# Patient Record
Sex: Male | Born: 1955 | Race: White | Hispanic: No | Marital: Married | Smoking: Never smoker
Health system: Southern US, Community
[De-identification: ages and names within clinical notes are randomized; demographics above are authoritative.]

---

## 2016-06-29 ENCOUNTER — Ambulatory Visit: Payer: 59 | Attending: Family Medicine

## 2016-06-29 DIAGNOSIS — M25652 Stiffness of left hip, not elsewhere classified: Secondary | ICD-10-CM | POA: Diagnosis present

## 2016-06-29 DIAGNOSIS — M25552 Pain in left hip: Secondary | ICD-10-CM

## 2016-06-29 NOTE — Patient Instructions (Addendum)
Perform all exercises below:  Hold _20___ seconds. Repeat _3___ times.  Do __3__ sessions per day. CAUTION: Movement should be gentle, steady and slow.  Knee to Chest  Lying supine, bend involved knee to chest. Perform with each leg.   HIP: Hamstrings - Short Sitting   Rest leg on raised surface. Keep knee straight. Lift chest.   Butterfly, Supine    Lie on back, feet together. Lower knees toward floor. Hold _20__ seconds. Repeat _3__ times per session. Do _3__ sessions per day.  Copyright  VHI. All rights reserved.  Hip Flexor Stretch    Lying on back near edge of bed, bend one leg, foot flat. Hang other leg over edge, relaxed, thigh resting entirely on bed for 20 seconds. Repeat _3___ times. Do __3__ sessions per day. Advanced Exercise: Bend knee back keeping thigh in contact with bed.  http://gt2.exer.us/346   Copyright  VHI. All rights reserved.    [facility] [address] [place], [place] [address] Phone # [phone] Fax [phone]

## 2016-06-29 NOTE — Therapy (Signed)
[facility] [address], [place], [place], [address] Phone: [phone]   Fax:  [phone]  Physical Therapy Evaluation  Patient Details  Name: Jimmy Tate MRN: [id number] Date of Birth: April 16, 1956 Referring Provider: Lujean Amel, MD  Encounter Date: 06/29/2016      PT End of Session - 06/29/16 0839    Visit Number 1   Date for PT Re-Evaluation 08/24/16   PT Start Time 0801   PT Stop Time 0835   PT Time Calculation (min) 34 min   Activity Tolerance Patient tolerated treatment well   Behavior During Therapy [facility] for tasks assessed/performed      History reviewed. No pertinent past medical history.  History reviewed. No pertinent surgical history.  There were no vitals filed for this visit.       Subjective Assessment - 06/29/16 0807    Subjective Pt presents to PT with complaints of Lt hip pain that began ~09/2015.  Pt reports that he overstretched it after a rowing workout.  Pt reports that pain has increased over the past 1 month.     How long can you sit comfortably? no limitation   How long can you stand comfortably? no limitation   How long can you walk comfortably? no limitation   Diagnostic tests none   Patient Stated Goals sleep without interruption, reduce Lt hip pain   Currently in Pain? Yes   Pain Score 3   no pain now, 3-5/10 at night   Pain Location Hip   Pain Orientation Left   Pain Descriptors / Indicators Aching;Sore;Dull   Pain Type Chronic pain   Pain Onset More than a month ago   Pain Frequency Intermittent   Aggravating Factors  at night with sleep, walking up steps    Pain Relieving Factors Advil, Biofreeze   Effect of Pain on Daily Activities sleep is disrupted- waking 1-2 times a night, pain with steps            [facility] PT Assessment - 06/29/16 0001      Assessment   Medical Diagnosis strain of Lt hip   Referring Provider Lujean Amel, MD   Onset Date/Surgical  Date 09/30/15   Next MD Visit 6 months   Prior Therapy none     Precautions   Precautions None     Restrictions   Weight Bearing Restrictions No     Balance Screen   Has the patient fallen in the past 6 months No   Has the patient had a decrease in activity level because of a fear of falling?  No   Is the patient reluctant to leave their home because of a fear of falling?  No     Home Environment   Living Environment Private residence   Living Arrangements Spouse/significant other   Type of [place] Access Level entry   [place] One level     Prior Function   Level of Independence Independent   Vocation Full time employment   Vocation Requirements desk work   Leisure tennis, walking for exercise     Cognition   Overall Cognitive Status Within Functional Limits for tasks assessed     Observation/Other Assessments   Focus on Therapeutic Outcomes (FOTO)  34% limitation     Posture/Postural Control   Posture/Postural Control No significant limitations     ROM / Strength   AROM / PROM / Strength AROM;PROM;Strength     AROM   Overall  AROM  Deficits   Overall AROM Comments Lumbar AROM is full without pain.  Lt hip AROM limited by 40% and Rt hip limited by 25%     PROM   Overall PROM  Deficits   Overall PROM Comments Lt hip flexiblity limited by 40%, Rt limited by 25% no pain     Strength   Overall Strength Within functional limits for tasks performed   Overall Strength Comments 4+/5 LE strength throughout     Palpation   Palpation comment no significant palpable tenderness today.  No tenderness at Lt quadartus, greater trochanter or gluteals     Transfers   Transfers Independent with all Transfers     Ambulation/Gait   Ambulation/Gait Yes   Ambulation/Gait Assistance 7: Independent   Gait Pattern Within Functional Limits                           PT Education - 06/29/16 0829    Education provided Yes   Education Details HEP:  hip stretches   Person(s) Educated Patient   Methods Explanation;Demonstration;Handout   Comprehension Verbalized understanding;Returned demonstration          PT Short Term Goals - 06/29/16 0812      PT SHORT TERM GOAL #1   Title be independent in initial HEP   Time 4   Period Weeks   Status New     PT SHORT TERM GOAL #2   Title ascend steps with 25% reduction in Lt hip pain   Time 4   Period Weeks   Status New     PT SHORT TERM GOAL #3   Title sleep with 25% less Lt hip pain and fewer interruptions   Time 4   Period Weeks   Status New           PT Long Term Goals - 06/29/16 0802      PT LONG TERM GOAL #1   Title be independent in advanced HEP   Time 8   Period Weeks   Status New     PT LONG TERM GOAL #2   Title reduce FOTO to < or = to 26% limitation   Time 8   Period Weeks   Status New     PT LONG TERM GOAL #3   Title ascend steps with 60% less Lt hip pain   Time 8   Period Weeks   Status New     PT LONG TERM GOAL #4   Title sleep without interruption due to Lt hip pain   Time 8   Period Weeks   Status New               Plan - 06/29/16 [id number]    Clinical Impression Statement Pt presents to PT with complaints of Lt hip pain that began 09/2015 after stretching after a workout.  Pain has worsened over the past month.  Pt reports that his pain is mostly present only at night and rates the pain in the hip as 3-5/10.  Pt also has pain with negotiating steps.  Pt demonstrates limited hip flexiblity Lt>Rt.  Pt is a low complexity evaluation due to no comorbidities and stable condition.  Pt will benefit from skilled PT for Lt hip flexiblity, strength, manual and modalities to reduce Lt hip pain.     Rehab Potential Good   PT Frequency 2x / week   PT Duration 8 weeks   PT Treatment/Interventions ADLs/Self Care  Home Management;Cryotherapy;Electrical Stimulation;Functional mobility training;Stair training;Ultrasound;Moist Heat;Therapeutic  activities;Therapeutic exercise;Balance training;Neuromuscular re-education;Patient/family education;Passive range of motion;Manual techniques;Dry needling;Taping   PT Next Visit Plan Lt hip flexiblity, manual, modalities, strength   Consulted and Agree with Plan of Care Patient      Patient will benefit from skilled therapeutic intervention in order to improve the following deficits and impairments:  Pain, Decreased mobility, Decreased activity tolerance, Impaired flexibility  Visit Diagnosis: Pain in left hip - Plan: PT plan of care cert/re-cert  Stiffness of left hip, not elsewhere classified - Plan: PT plan of care cert/re-cert     Problem List There are no active problems to display for this patient.    Sigurd Sos, PT 06/29/16 8:41 AM  [facility] [address], [place], [place], [address] Phone: [phone]   Fax:  [phone]  Name: Constantinos Arballo MRN: [id number] Date of Birth: Mar 11, 1956

## 2016-07-01 ENCOUNTER — Ambulatory Visit: Payer: 59

## 2016-07-01 DIAGNOSIS — M25552 Pain in left hip: Secondary | ICD-10-CM

## 2016-07-01 DIAGNOSIS — M25652 Stiffness of left hip, not elsewhere classified: Secondary | ICD-10-CM

## 2016-07-01 NOTE — Patient Instructions (Addendum)
Abduction: Clam (Eccentric) - Side-Lying    Lie on side with knees bent. Lift top knee, keeping feet together. Keep trunk steady. Do 2 sets of 10 on each side http://ecce.exer.us/64   Copyright  VHI. All rights reserved.  Bridging    Slowly raise buttocks from floor, keeping stomach tight.  Hold 5 seconds  Repeat _2x10___ times per set. Do ____ sets per session. Do _1___ sessions per day.  http://orth.exer.us/1096   Copyright  VHI. All rights reserved.  [facility] [address], [place],  [address] Phone # [phone] Fax [phone]

## 2016-07-01 NOTE — Therapy (Signed)
[facility] [address], [place], [place], [address] Phone: [phone]   Fax:  [phone]  Physical Therapy Treatment  Patient Details  Name: Jimmy Tate MRN: [id number] Date of Birth: 1956-01-01 Referring Provider: Lujean Amel, MD  Encounter Date: 07/01/2016      PT End of Session - 07/01/16 0837    Visit Number 2   Date for PT Re-Evaluation 08/24/16   PT Start Time 0800   PT Stop Time 0839   PT Time Calculation (min) 39 min   Activity Tolerance Patient tolerated treatment well   Behavior During Therapy [facility] for tasks assessed/performed      History reviewed. No pertinent past medical history.  History reviewed. No pertinent surgical history.  There were no vitals filed for this visit.      Subjective Assessment - 07/01/16 0804    Subjective Feeling good.  Pain is usually at night so no pain right now.   Patient Stated Goals sleep without interruption, reduce Lt hip pain   Currently in Pain? No/denies                         [facility] Adult PT Treatment/Exercise - 07/01/16 0001      Exercises   Exercises Lumbar;Knee/Hip     Lumbar Exercises: Stretches   Active Hamstring Stretch 3 reps;20 seconds   Hip Flexor Stretch 3 reps;20 seconds   Piriformis Stretch 3 reps;20 seconds     Lumbar Exercises: Sidelying   Clam 20 reps     Knee/Hip Exercises: Stretches   Other Knee/Hip Stretches butterfly 3x20 seconds     Knee/Hip Exercises: Aerobic   Nustep Level 2x 8 minutes-legs only     Knee/Hip Exercises: Supine   Bridges Strengthening;Both;2 sets;10 reps                PT Education - 07/01/16 0830    Education provided Yes   Education Details bridge and clam shells   Person(s) Educated Patient   Methods Explanation;Demonstration;Handout   Comprehension Verbalized understanding;Returned demonstration          PT Short Term Goals - 06/29/16 0812      PT SHORT TERM  GOAL #1   Title be independent in initial HEP   Time 4   Period Weeks   Status New     PT SHORT TERM GOAL #2   Title ascend steps with 25% reduction in Lt hip pain   Time 4   Period Weeks   Status New     PT SHORT TERM GOAL #3   Title sleep with 25% less Lt hip pain and fewer interruptions   Time 4   Period Weeks   Status New           PT Long Term Goals - 06/29/16 0802      PT LONG TERM GOAL #1   Title be independent in advanced HEP   Time 8   Period Weeks   Status New     PT LONG TERM GOAL #2   Title reduce FOTO to < or = to 26% limitation   Time 8   Period Weeks   Status New     PT LONG TERM GOAL #3   Title ascend steps with 60% less Lt hip pain   Time 8   Period Weeks   Status New     PT LONG TERM GOAL #4   Title sleep without interruption due  to Lt hip pain   Time 8   Period Weeks   Status New               Plan - 07/01/16 [id number]    Clinical Impression Statement Pt with only 1 session after evaluation.  Pt is independent in initial HEP for flexibility.  Pt tolerated advancement of strength exercises today.  Pt reports intermittent pain at night.  No pain reported today.  Pt will continue to benefit from skilled PT for strength, flexiblity and manual/modalities for pain as needed.     Rehab Potential Good   PT Frequency 2x / week   PT Duration 8 weeks   PT Treatment/Interventions ADLs/Self Care Home Management;Cryotherapy;Electrical Stimulation;Functional mobility training;Stair training;Ultrasound;Moist Heat;Therapeutic activities;Therapeutic exercise;Balance training;Neuromuscular re-education;Patient/family education;Passive range of motion;Manual techniques;Dry needling;Taping   PT Next Visit Plan Lt hip flexiblity, manual, modalities, strength   Consulted and Agree with Plan of Care Patient      Patient will benefit from skilled therapeutic intervention in order to improve the following deficits and impairments:  Pain, Decreased mobility,  Decreased activity tolerance, Impaired flexibility  Visit Diagnosis: Pain in left hip  Stiffness of left hip, not elsewhere classified     Problem List There are no active problems to display for this patient.    Sigurd Sos, PT 07/01/16 8:40 AM  [facility] [address] [place], [place], [address] Phone: [phone]   Fax:  [phone]  Name: Jimmy Tate MRN: [id number] Date of Birth: [birth year]

## 2016-07-07 ENCOUNTER — Encounter: Payer: 59 | Admitting: Physical Therapy

## 2016-07-12 ENCOUNTER — Ambulatory Visit: Payer: 59 | Admitting: Physical Therapy

## 2016-07-12 ENCOUNTER — Encounter: Payer: Self-pay | Admitting: Physical Therapy

## 2016-07-12 DIAGNOSIS — M25552 Pain in left hip: Secondary | ICD-10-CM | POA: Diagnosis not present

## 2016-07-12 DIAGNOSIS — M25652 Stiffness of left hip, not elsewhere classified: Secondary | ICD-10-CM

## 2016-07-12 NOTE — Therapy (Signed)
[facility] [address] [place], [place], [address] Phone: [phone]   Fax:  [phone]  Physical Therapy Treatment  Patient Details  Name: Jimmy Tate MRN: [id number] Date of Birth: [birth year] Referring Provider: Lujean Amel, MD  Encounter Date: 07/12/2016      PT End of Session - 07/12/16 0852    Visit Number 3   Date for PT Re-Evaluation 08/24/16   PT Start Time [id number]   PT Stop Time 0927   PT Time Calculation (min) 40 min   Activity Tolerance Patient tolerated treatment well   Behavior During Therapy [facility] for tasks assessed/performed      History reviewed. No pertinent past medical history.  History reviewed. No pertinent surgical history.  There were no vitals filed for this visit.      Subjective Assessment - 07/12/16 0849    Subjective Walked 3 miles a day over the weekend and it was a little sore but not too bad.  States it has been better overall and he has been able to sleep through the night   Currently in Pain? No/denies                         [facility] Adult PT Treatment/Exercise - 07/12/16 0001      Lumbar Exercises: Stretches   Active Hamstring Stretch 3 reps;20 seconds   Hip Flexor Stretch 3 reps;20 seconds   Quad Stretch 3 reps;20 seconds   Piriformis Stretch 3 reps;20 seconds     Knee/Hip Exercises: Aerobic   Nustep Level 3x 8 minutes-legs only     Knee/Hip Exercises: Standing   Hip ADduction Strengthening;Both;2 sets;10 reps   Lateral Step Up Left;2 sets;10 reps   Forward Step Up Left;2 sets;10 reps   Wall Squat 20 reps;3 seconds     Knee/Hip Exercises: Supine   Bridges Strengthening;Both;2 sets;10 reps  yellow theraband for glute med activation     Knee/Hip Exercises: Sidelying   Clams 20x yellow theraband Lt LE                PT Education - 07/12/16 [phone]    Education provided Yes   Education Details educated on adding yellow theraband to  exercises and forward and lateral step ups   Person(s) Educated Patient   Methods Explanation;Verbal cues;Demonstration   Comprehension Verbalized understanding;Returned demonstration          PT Short Term Goals - 07/12/16 0917      PT SHORT TERM GOAL #1   Title be independent in initial HEP   Time 4   Period Weeks   Status Achieved     PT SHORT TERM GOAL #2   Title ascend steps with 25% reduction in Lt hip pain   Baseline 50% less pain pain   Time 4   Period Weeks   Status Achieved           PT Long Term Goals - 06/29/16 0802      PT LONG TERM GOAL #1   Title be independent in advanced HEP   Time 8   Period Weeks   Status New     PT LONG TERM GOAL #2   Title reduce FOTO to < or = to 26% limitation   Time 8   Period Weeks   Status New     PT LONG TERM GOAL #3   Title ascend steps with 60% less Lt hip pain   Time 8  Period Weeks   Status New     PT LONG TERM GOAL #4   Title sleep without interruption due to Lt hip pain   Time 8   Period Weeks   Status New               Plan - 07/12/16 [id number]    Clinical Impression Statement Pt demonstrates good understanding of current HEP and able to progress with addition of resistance and able to tolerate more strengthening exercises. Pt reported muscle fatigue during wall squats and needs cues for equal weight distribution.  Pt will continue to benefit from skilled PT for strength and ROM in order to resolve pain issues during ambulation and going up stairs as well as reduce risk for future injury.   Rehab Potential Good   PT Frequency 2x / week   PT Duration 8 weeks   PT Treatment/Interventions ADLs/Self Care Home Management;Cryotherapy;Electrical Stimulation;Functional mobility training;Stair training;Ultrasound;Moist Heat;Therapeutic activities;Therapeutic exercise;Balance training;Neuromuscular re-education;Patient/family education;Passive range of motion;Manual techniques;Dry needling;Taping   PT Next  Visit Plan Lt hip flexiblity, manual, modalities, strength   PT Home Exercise Plan progress as needed   Consulted and Agree with Plan of Care Patient      Patient will benefit from skilled therapeutic intervention in order to improve the following deficits and impairments:  Pain, Decreased mobility, Decreased activity tolerance, Impaired flexibility  Visit Diagnosis: Pain in left hip  Stiffness of left hip, not elsewhere classified     Problem List There are no active problems to display for this patient.   Zannie Cove, PT 07/12/2016, 9:36 AM  [facility] [address] [place], [place], [address] Phone: [phone]   Fax:  [phone]  Name: Jimmy Tate MRN: [id number] Date of Birth: 1956-07-05

## 2016-07-15 ENCOUNTER — Ambulatory Visit: Payer: 59 | Admitting: Physical Therapy

## 2016-07-15 ENCOUNTER — Encounter: Payer: Self-pay | Admitting: Physical Therapy

## 2016-07-15 DIAGNOSIS — M25552 Pain in left hip: Secondary | ICD-10-CM

## 2016-07-15 DIAGNOSIS — M25652 Stiffness of left hip, not elsewhere classified: Secondary | ICD-10-CM

## 2016-07-15 NOTE — Therapy (Addendum)
[facility] [address] [place], [place], [address] Phone: [phone]   Fax:  [phone]  Physical Therapy Treatment  Patient Details  Name: Jimmy Tate MRN: [id number] Date of Birth: 04/14/1956 Referring Provider: Lujean Amel, MD  Encounter Date: 07/15/2016      PT End of Session - 07/15/16 0806    Visit Number 4   Date for PT Re-Evaluation 08/24/16   PT Start Time 0800   PT Stop Time 0838   PT Time Calculation (min) 38 min   Activity Tolerance Patient tolerated treatment well   Behavior During Therapy [facility] for tasks assessed/performed      History reviewed. No pertinent past medical history.  History reviewed. No pertinent surgical history.  There were no vitals filed for this visit.      Subjective Assessment - 07/15/16 0803    Subjective Pt reports he felt like it was a good work out last time and did not have any problems.  Pt states had a little pain when he woke up this morning but went away after moving around   Currently in Pain? No/denies                         [facility] Adult PT Treatment/Exercise - 07/15/16 0001      Lumbar Exercises: Stretches   Active Hamstring Stretch 3 reps;20 seconds   Hip Flexor Stretch 3 reps;20 seconds   Quad Stretch 3 reps;20 seconds   Piriformis Stretch 3 reps;20 seconds     Lumbar Exercises: Supine   Bent Knee Raise 20 reps;3 seconds  2.5# ankle weights   Bridge 20 reps  red band for glute activation   Straight Leg Raise 20 reps     Lumbar Exercises: Sidelying   Hip Abduction 15 reps     Knee/Hip Exercises: Aerobic   Nustep Level 3x 8 minutes-legs only     Knee/Hip Exercises: Standing   Hip ADduction Strengthening;Both;2 sets;15 reps  second set, added 2.5# ankle weight   Hip Extension Stengthening;Both;2 sets;15 reps  second set, added 2.5# ankle weight   Wall Squat 20 reps;3 seconds                  PT Short Term  Goals - 07/15/16 0807      PT SHORT TERM GOAL #3   Title sleep with 25% less Lt hip pain and fewer interruptions   Time 4   Period Weeks   Status Achieved           PT Long Term Goals - 06/29/16 0802      PT LONG TERM GOAL #1   Title be independent in advanced HEP   Time 8   Period Weeks   Status New     PT LONG TERM GOAL #2   Title reduce FOTO to < or = to 26% limitation   Time 8   Period Weeks   Status New     PT LONG TERM GOAL #3   Title ascend steps with 60% less Lt hip pain   Time 8   Period Weeks   Status New     PT LONG TERM GOAL #4   Title sleep without interruption due to Lt hip pain   Time 8   Period Weeks   Status New               Plan - 07/15/16 2694    Clinical Impression Statement  Pt demonstrates increased strength with more endurance and increased resistance and difficulty of exercises.  Pt has decreased Lt hip ROM see when doing piriformis stretch and pt has some pain when performing piriformis stretch but states it is getting better.  Pt will benefit from skilled PT for increased core and LE strength as well as joint mobs to work on greater hip ROM to reduce risk of injury.   Rehab Potential Good   PT Frequency 2x / week   PT Duration 8 weeks   PT Treatment/Interventions ADLs/Self Care Home Management;Cryotherapy;Electrical Stimulation;Functional mobility training;Stair training;Ultrasound;Moist Heat;Therapeutic activities;Therapeutic exercise;Balance training;Neuromuscular re-education;Patient/family education;Passive range of motion;Manual techniques;Dry needling;Taping   PT Next Visit Plan Lt hip flexiblity, progress strength, add L hip joint mobs   PT Home Exercise Plan progress as needed   Consulted and Agree with Plan of Care Patient      Patient will benefit from skilled therapeutic intervention in order to improve the following deficits and impairments:  Pain, Decreased mobility, Decreased activity tolerance, Impaired  flexibility  Visit Diagnosis: Pain in left hip  Stiffness of left hip, not elsewhere classified     Problem List There are no active problems to display for this patient.   Zannie Cove, PT 07/15/2016, 9:24 AM PHYSICAL THERAPY DISCHARGE SUMMARY  Visits from Start of Care: 4  Current functional level related to goals / functional outcomes: See above for current status.  Pt didn't return to PT.   Remaining deficits: See above.     Education / Equipment: HEP Plan: Patient agrees to discharge.  Patient goals were partially met. Patient is being discharged due to not returning since the last visit.  ?????       Sigurd Sos, PT 09/30/16 8:28 AM  [facility] [address] [place], [place], [address] Phone: [phone]   Fax:  [phone]  Name: Jimmy Tate MRN: [id number] Date of Birth: May 13, 1956

## 2016-09-17 DIAGNOSIS — L111 Transient acantholytic dermatosis [Grover]: Secondary | ICD-10-CM | POA: Diagnosis not present

## 2016-09-17 DIAGNOSIS — L2089 Other atopic dermatitis: Secondary | ICD-10-CM | POA: Diagnosis not present

## 2016-12-23 DIAGNOSIS — R030 Elevated blood-pressure reading, without diagnosis of hypertension: Secondary | ICD-10-CM | POA: Diagnosis not present

## 2017-04-20 DIAGNOSIS — L819 Disorder of pigmentation, unspecified: Secondary | ICD-10-CM | POA: Diagnosis not present

## 2017-04-20 DIAGNOSIS — D1801 Hemangioma of skin and subcutaneous tissue: Secondary | ICD-10-CM | POA: Diagnosis not present

## 2017-04-20 DIAGNOSIS — B078 Other viral warts: Secondary | ICD-10-CM | POA: Diagnosis not present

## 2017-04-20 DIAGNOSIS — L821 Other seborrheic keratosis: Secondary | ICD-10-CM | POA: Diagnosis not present

## 2017-05-11 DIAGNOSIS — R972 Elevated prostate specific antigen [PSA]: Secondary | ICD-10-CM | POA: Diagnosis not present

## 2017-05-18 DIAGNOSIS — R972 Elevated prostate specific antigen [PSA]: Secondary | ICD-10-CM | POA: Diagnosis not present

## 2017-05-30 DIAGNOSIS — H2513 Age-related nuclear cataract, bilateral: Secondary | ICD-10-CM | POA: Diagnosis not present

## 2017-06-18 DIAGNOSIS — Z23 Encounter for immunization: Secondary | ICD-10-CM | POA: Diagnosis not present

## 2017-07-13 DIAGNOSIS — Z13220 Encounter for screening for lipoid disorders: Secondary | ICD-10-CM | POA: Diagnosis not present

## 2017-07-13 DIAGNOSIS — Z0000 Encounter for general adult medical examination without abnormal findings: Secondary | ICD-10-CM | POA: Diagnosis not present

## 2017-07-13 DIAGNOSIS — Z23 Encounter for immunization: Secondary | ICD-10-CM | POA: Diagnosis not present

## 2018-05-17 DIAGNOSIS — R972 Elevated prostate specific antigen [PSA]: Secondary | ICD-10-CM | POA: Diagnosis not present

## 2018-05-24 DIAGNOSIS — R972 Elevated prostate specific antigen [PSA]: Secondary | ICD-10-CM | POA: Diagnosis not present

## 2018-05-26 DIAGNOSIS — D225 Melanocytic nevi of trunk: Secondary | ICD-10-CM | POA: Diagnosis not present

## 2018-05-26 DIAGNOSIS — L821 Other seborrheic keratosis: Secondary | ICD-10-CM | POA: Diagnosis not present

## 2018-05-26 DIAGNOSIS — L819 Disorder of pigmentation, unspecified: Secondary | ICD-10-CM | POA: Diagnosis not present

## 2018-05-27 DIAGNOSIS — Z23 Encounter for immunization: Secondary | ICD-10-CM | POA: Diagnosis not present

## 2018-06-05 DIAGNOSIS — H2513 Age-related nuclear cataract, bilateral: Secondary | ICD-10-CM | POA: Diagnosis not present

## 2018-07-19 DIAGNOSIS — Z13220 Encounter for screening for lipoid disorders: Secondary | ICD-10-CM | POA: Diagnosis not present

## 2018-07-19 DIAGNOSIS — Z8249 Family history of ischemic heart disease and other diseases of the circulatory system: Secondary | ICD-10-CM | POA: Diagnosis not present

## 2018-07-19 DIAGNOSIS — Z0000 Encounter for general adult medical examination without abnormal findings: Secondary | ICD-10-CM | POA: Diagnosis not present

## 2018-09-07 DIAGNOSIS — M5136 Other intervertebral disc degeneration, lumbar region: Secondary | ICD-10-CM | POA: Diagnosis not present

## 2018-09-07 DIAGNOSIS — M9903 Segmental and somatic dysfunction of lumbar region: Secondary | ICD-10-CM | POA: Diagnosis not present

## 2018-09-07 DIAGNOSIS — M9905 Segmental and somatic dysfunction of pelvic region: Secondary | ICD-10-CM | POA: Diagnosis not present

## 2018-09-11 DIAGNOSIS — M9905 Segmental and somatic dysfunction of pelvic region: Secondary | ICD-10-CM | POA: Diagnosis not present

## 2018-09-11 DIAGNOSIS — M9903 Segmental and somatic dysfunction of lumbar region: Secondary | ICD-10-CM | POA: Diagnosis not present

## 2018-09-11 DIAGNOSIS — M5136 Other intervertebral disc degeneration, lumbar region: Secondary | ICD-10-CM | POA: Diagnosis not present

## 2018-09-14 DIAGNOSIS — M9905 Segmental and somatic dysfunction of pelvic region: Secondary | ICD-10-CM | POA: Diagnosis not present

## 2018-09-14 DIAGNOSIS — M9903 Segmental and somatic dysfunction of lumbar region: Secondary | ICD-10-CM | POA: Diagnosis not present

## 2018-09-14 DIAGNOSIS — M5136 Other intervertebral disc degeneration, lumbar region: Secondary | ICD-10-CM | POA: Diagnosis not present

## 2018-09-20 DIAGNOSIS — M5136 Other intervertebral disc degeneration, lumbar region: Secondary | ICD-10-CM | POA: Diagnosis not present

## 2018-09-20 DIAGNOSIS — M9903 Segmental and somatic dysfunction of lumbar region: Secondary | ICD-10-CM | POA: Diagnosis not present

## 2018-09-20 DIAGNOSIS — M9905 Segmental and somatic dysfunction of pelvic region: Secondary | ICD-10-CM | POA: Diagnosis not present

## 2018-09-22 DIAGNOSIS — M5136 Other intervertebral disc degeneration, lumbar region: Secondary | ICD-10-CM | POA: Diagnosis not present

## 2018-09-22 DIAGNOSIS — M9905 Segmental and somatic dysfunction of pelvic region: Secondary | ICD-10-CM | POA: Diagnosis not present

## 2018-09-22 DIAGNOSIS — M9903 Segmental and somatic dysfunction of lumbar region: Secondary | ICD-10-CM | POA: Diagnosis not present

## 2018-09-26 DIAGNOSIS — M5136 Other intervertebral disc degeneration, lumbar region: Secondary | ICD-10-CM | POA: Diagnosis not present

## 2018-09-26 DIAGNOSIS — M9905 Segmental and somatic dysfunction of pelvic region: Secondary | ICD-10-CM | POA: Diagnosis not present

## 2018-09-26 DIAGNOSIS — M9903 Segmental and somatic dysfunction of lumbar region: Secondary | ICD-10-CM | POA: Diagnosis not present

## 2018-09-28 DIAGNOSIS — M9903 Segmental and somatic dysfunction of lumbar region: Secondary | ICD-10-CM | POA: Diagnosis not present

## 2018-09-28 DIAGNOSIS — M5136 Other intervertebral disc degeneration, lumbar region: Secondary | ICD-10-CM | POA: Diagnosis not present

## 2018-09-28 DIAGNOSIS — M9905 Segmental and somatic dysfunction of pelvic region: Secondary | ICD-10-CM | POA: Diagnosis not present

## 2018-10-05 DIAGNOSIS — M5136 Other intervertebral disc degeneration, lumbar region: Secondary | ICD-10-CM | POA: Diagnosis not present

## 2018-10-05 DIAGNOSIS — M9903 Segmental and somatic dysfunction of lumbar region: Secondary | ICD-10-CM | POA: Diagnosis not present

## 2018-10-05 DIAGNOSIS — M9905 Segmental and somatic dysfunction of pelvic region: Secondary | ICD-10-CM | POA: Diagnosis not present

## 2018-10-12 DIAGNOSIS — M9905 Segmental and somatic dysfunction of pelvic region: Secondary | ICD-10-CM | POA: Diagnosis not present

## 2018-10-12 DIAGNOSIS — M9903 Segmental and somatic dysfunction of lumbar region: Secondary | ICD-10-CM | POA: Diagnosis not present

## 2018-10-12 DIAGNOSIS — M5136 Other intervertebral disc degeneration, lumbar region: Secondary | ICD-10-CM | POA: Diagnosis not present

## 2018-10-19 DIAGNOSIS — M5136 Other intervertebral disc degeneration, lumbar region: Secondary | ICD-10-CM | POA: Diagnosis not present

## 2018-10-19 DIAGNOSIS — M9903 Segmental and somatic dysfunction of lumbar region: Secondary | ICD-10-CM | POA: Diagnosis not present

## 2018-10-19 DIAGNOSIS — M9905 Segmental and somatic dysfunction of pelvic region: Secondary | ICD-10-CM | POA: Diagnosis not present

## 2018-11-02 DIAGNOSIS — M9905 Segmental and somatic dysfunction of pelvic region: Secondary | ICD-10-CM | POA: Diagnosis not present

## 2018-11-02 DIAGNOSIS — M9903 Segmental and somatic dysfunction of lumbar region: Secondary | ICD-10-CM | POA: Diagnosis not present

## 2018-11-02 DIAGNOSIS — M5136 Other intervertebral disc degeneration, lumbar region: Secondary | ICD-10-CM | POA: Diagnosis not present

## 2019-08-22 ENCOUNTER — Ambulatory Visit: Payer: 59 | Attending: Internal Medicine

## 2019-08-22 DIAGNOSIS — Z20822 Contact with and (suspected) exposure to covid-19: Secondary | ICD-10-CM

## 2019-08-23 LAB — NOVEL CORONAVIRUS, NAA: SARS-CoV-2, NAA: NOT DETECTED

## 2020-05-30 ENCOUNTER — Other Ambulatory Visit: Payer: Self-pay | Admitting: Urology

## 2020-05-30 DIAGNOSIS — R972 Elevated prostate specific antigen [PSA]: Secondary | ICD-10-CM

## 2020-06-21 ENCOUNTER — Ambulatory Visit
Admission: RE | Admit: 2020-06-21 | Discharge: 2020-06-21 | Disposition: A | Discharge status: Home / Self Care | Payer: 59 | Source: Ambulatory Visit | Attending: Urology | Admitting: Urology

## 2020-06-21 ENCOUNTER — Other Ambulatory Visit: Payer: Self-pay

## 2020-06-21 DIAGNOSIS — R972 Elevated prostate specific antigen [PSA]: Secondary | ICD-10-CM

## 2020-06-21 MED ORDER — GADOBENATE DIMEGLUMINE 529 MG/ML IV SOLN
20.0000 mL | Freq: Once | INTRAVENOUS | Status: AC | PRN
  Administered 2020-06-21: 20 mL via INTRAVENOUS

## 2021-10-21 ENCOUNTER — Other Ambulatory Visit (HOSPITAL_COMMUNITY): Payer: Self-pay | Admitting: Family Medicine

## 2021-10-21 DIAGNOSIS — E7800 Pure hypercholesterolemia, unspecified: Secondary | ICD-10-CM

## 2021-11-05 ENCOUNTER — Other Ambulatory Visit: Payer: Self-pay

## 2021-11-05 ENCOUNTER — Ambulatory Visit (HOSPITAL_COMMUNITY)
Admission: RE | Admit: 2021-11-05 | Discharge: 2021-11-05 | Disposition: A | Discharge status: Home / Self Care | Payer: Self-pay | Source: Ambulatory Visit | Attending: Family Medicine | Admitting: Family Medicine

## 2021-11-05 DIAGNOSIS — E7800 Pure hypercholesterolemia, unspecified: Secondary | ICD-10-CM | POA: Insufficient documentation

## 2022-07-14 ENCOUNTER — Other Ambulatory Visit: Payer: Self-pay | Admitting: Family Medicine

## 2022-07-14 ENCOUNTER — Ambulatory Visit
Admission: RE | Admit: 2022-07-14 | Discharge: 2022-07-14 | Disposition: A | Discharge status: Home / Self Care | Payer: Medicare Other | Source: Ambulatory Visit | Attending: Family Medicine | Admitting: Family Medicine

## 2022-07-14 DIAGNOSIS — R053 Chronic cough: Secondary | ICD-10-CM

## 2022-09-14 IMAGING — CT CT CARDIAC CORONARY ARTERY CALCIUM SCORE
2 series · 14 of 20 positions shown, 16 images · non-contrast
Comparison: None.

Addendum:
CLINICAL DATA: Cardiovascular Disease Risk stratification

EXAM:
Coronary Calcium Score
TECHNIQUE: A gated, non-contrast computed tomography scan of the heart was
performed using 3mm slice thickness. Axial images were analyzed on a
dedicated workstation. Calcium scoring of the coronary arteries was
performed using the Agatston method.

[Series 3: cascseq 2.0 b35f 70% · axial · 0.39mm/px · z∈[+1203,+1297]mm · 6 of 86 slices shown]
[im 10/86  vessel]
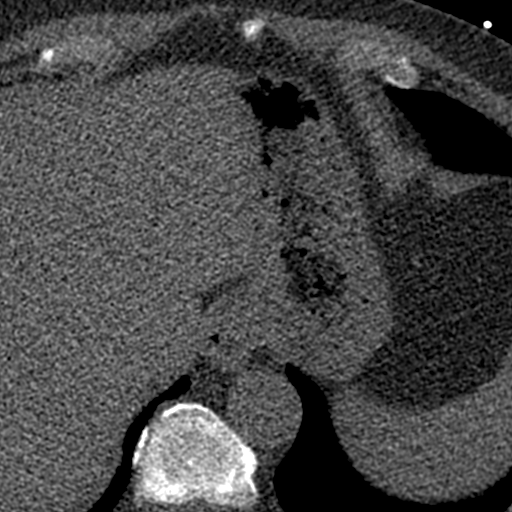
[im 19/86  vessel]
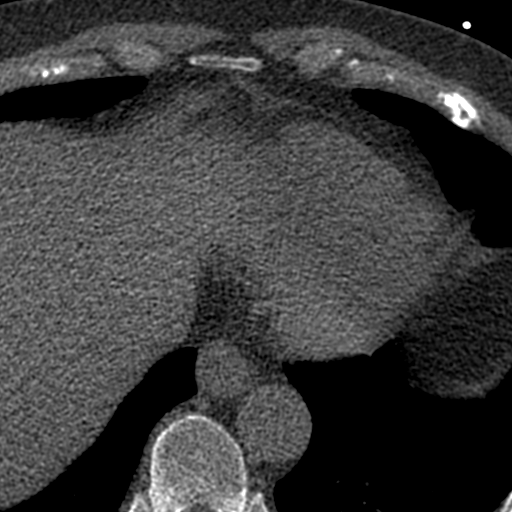
[im 29/86  vessel]
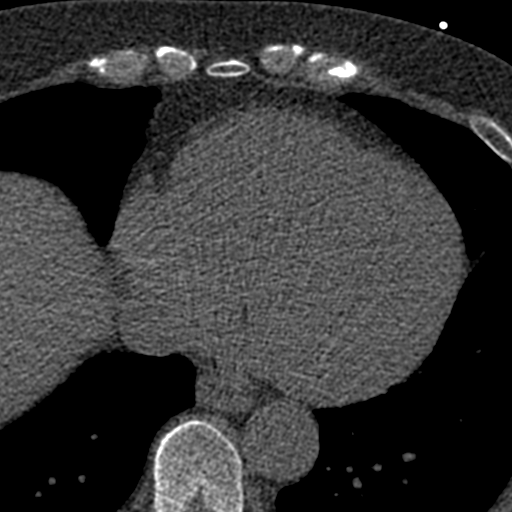
[im 38/86  vessel]
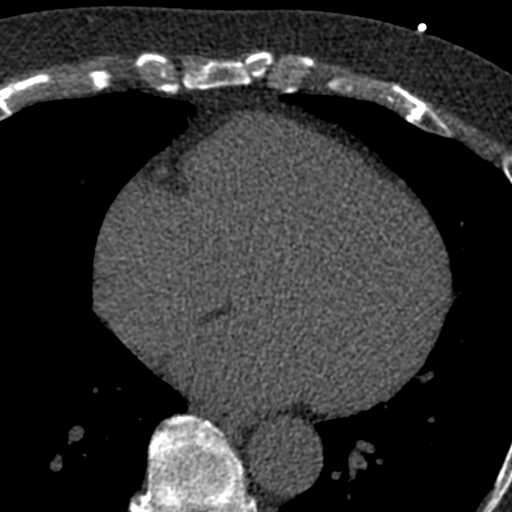
[im 48/86  vessel]
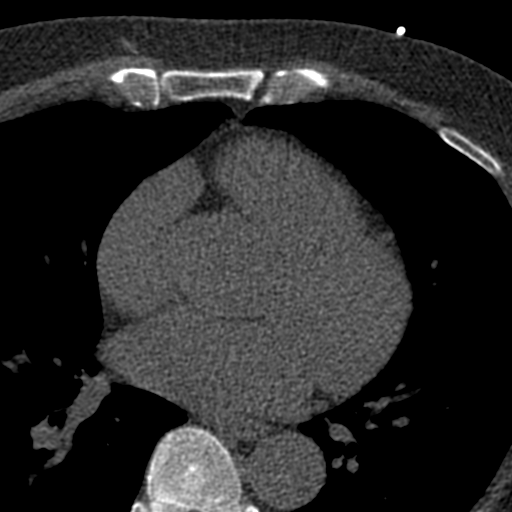
[im 57/86  vessel]
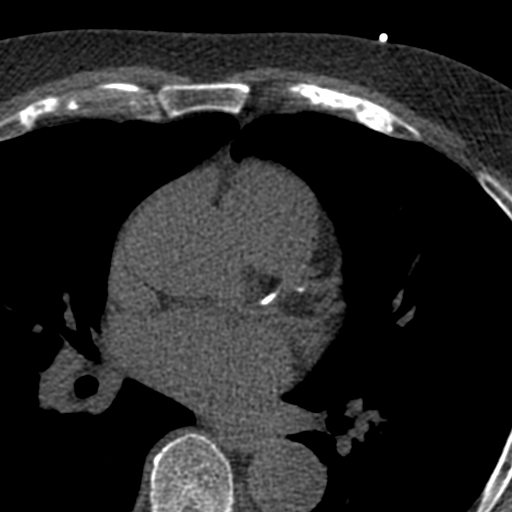

[Series 4: ax st full fov · axial · 0.78mm/px · z∈[+1203,+1335]mm · 8 of 86 slices shown, 10 images]
[im 10/86  vessel]
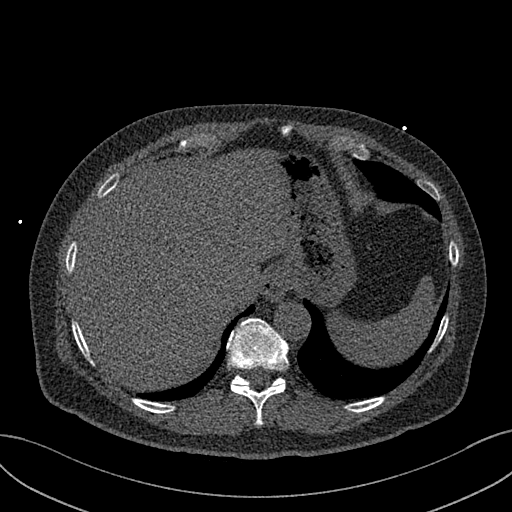
[im 10/86  lung]
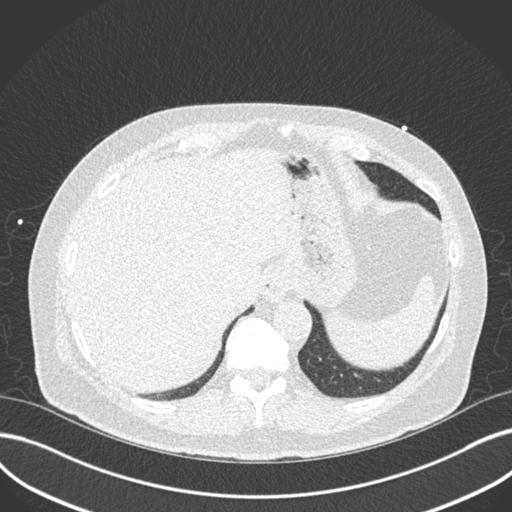
[im 19/86  vessel]
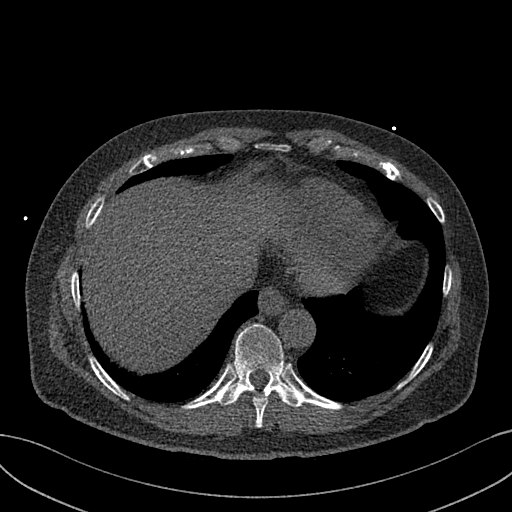
[im 29/86  vessel]
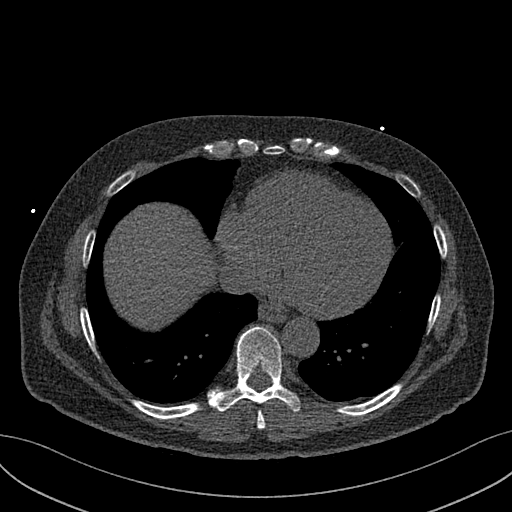
[im 38/86  vessel]
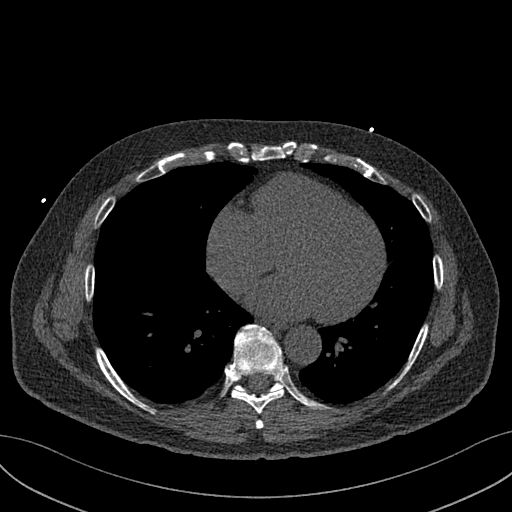
[im 48/86  vessel]
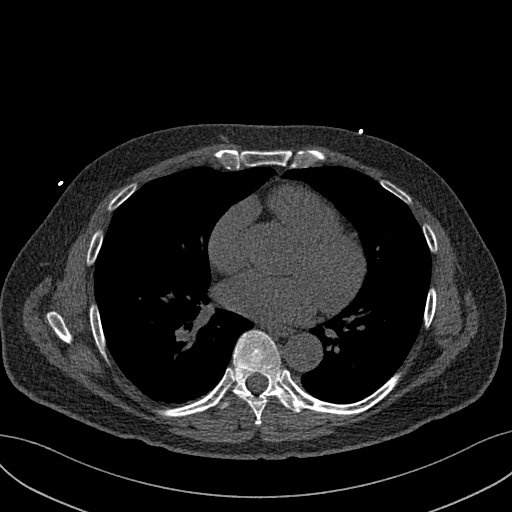
[im 48/86  lung]
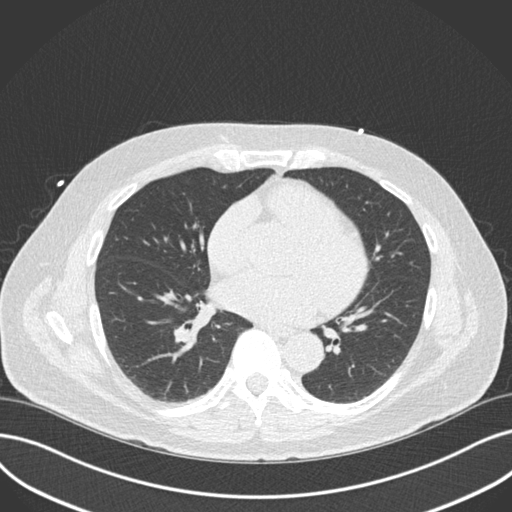
[im 57/86  vessel]
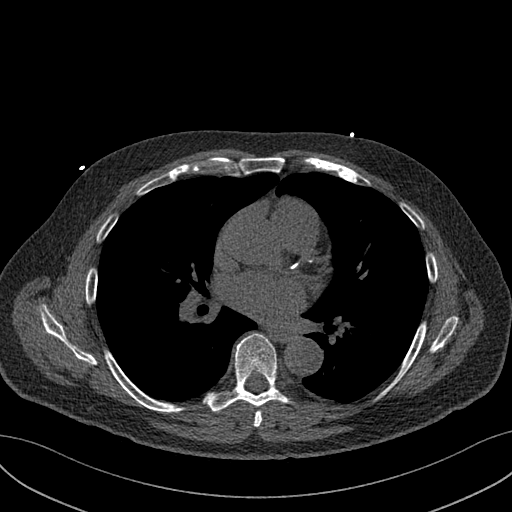
[im 67/86  vessel]
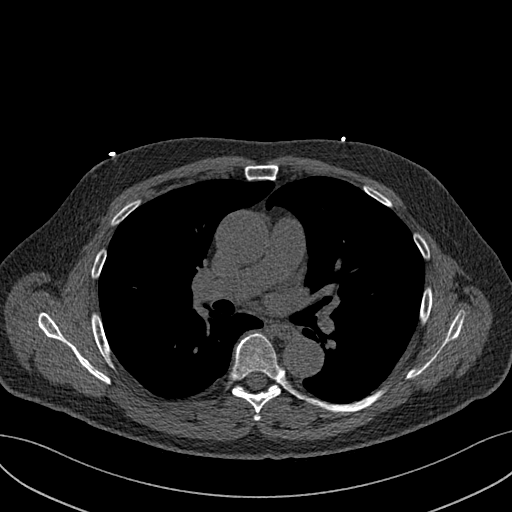
[im 76/86  vessel]
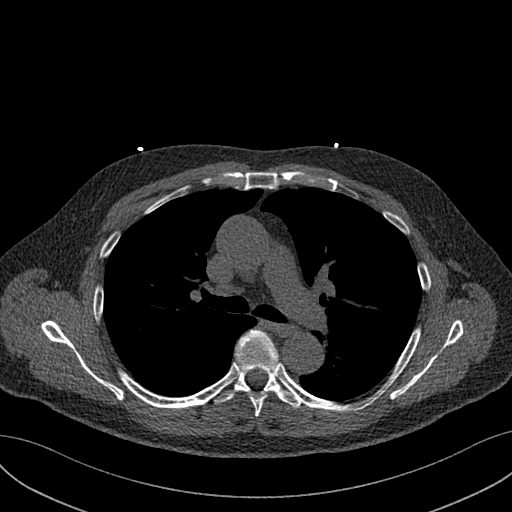

[14 of 20 positions shown; findings below may reference images not displayed]

FINDINGS: Coronary Calcium Score:

Left main: 0

Left anterior descending artery: 107

Left circumflex artery: 0

Right coronary artery: 0

Total: 107

Percentile: 56

Pericardium: Normal.

Ascending Aorta: Mildy dilated aortic root (42 mm).

Non-cardiac: See separate report from [REDACTED].
IMPRESSION: Coronary calcium score of 107. This was 56 percentile for age-,
race-, and sex-matched controls.

Mildly dilated aortic root (42 mm).



If CAC=0, it is reasonable to withhold statin therapy and reassess
in 5 to 10 years, as long as higher risk conditions are absent
(diabetes mellitus, family history of premature CHD in first degree
relatives (males <55 years; females <65 years), cigarette smoking,
or LDL >=190 mg/dL).

If CAC is 1 to 99, it is reasonable to initiate statin therapy for
patients >=55 years of age.

If CAC is >=100 or >=75th percentile, it is reasonable to initiate
statin therapy at any age.

Cardiology referral should be considered for patients with CAC
scores >=400 or >=75th percentile.

*2678 AHA/ACC/AACVPR/AAPA/ABC/FLOTZINGER/GABITU/BRADLEY/Vivek/NOMASIBULELE/DOSA/MERIEM
Guideline on the Management of Blood Cholesterol: A Report of the
American College of Cardiology/American Heart Association Task Force
on Clinical Practice Guidelines. J Am Coll Cardiol.
7267;73(24):4447-4507.

EXAM:
OVER-READ INTERPRETATION  CT CHEST

The following report is an over-read performed by radiologist Dr.
over-read does not include interpretation of cardiac or coronary
anatomy or pathology. The coronary calcium score interpretation by
the cardiologist is attached.
FINDINGS: Dilated ascending thoracic aorta measures approximately 4.1-4.2 cm
in greatest diameter by unenhanced CT. Visualized mediastinum and
hilar regions demonstrate no lymphadenopathy or masses. Visualized
lungs show no evidence of pulmonary edema, consolidation,
pneumothorax, nodule or pleural fluid. Visualized upper abdomen and
bony structures are unremarkable.
IMPRESSION: Mild dilatation of the ascending thoracic aorta measuring
approximately 4.1-4.2 cm in greatest diameter by unenhanced CT.
Recommend annual imaging followup by CTA or MRA. This recommendation
follows 4929 ACCF/AHA/AATS/ACR/ASA/SCA/SWARTZ/TARVER/CARL JOSEPH/WIESNER Guidelines
for the Diagnosis and Management of Patients with Thoracic Aortic
Disease. Circulation. 4929; 121: E266-e369. Aortic aneurysm NOS
([id number])

*** End of Addendum ***
FINDINGS: Coronary Calcium Score:

Left main: 0

Left anterior descending artery: 107

Left circumflex artery: 0

Right coronary artery: 0

Total: 107

Percentile: 56

Pericardium: Normal.

Ascending Aorta: Mildy dilated aortic root (42 mm).

Non-cardiac: See separate report from [REDACTED].
IMPRESSION: Coronary calcium score of 107. This was 56 percentile for age-,
race-, and sex-matched controls.

Mildly dilated aortic root (42 mm).



If CAC=0, it is reasonable to withhold statin therapy and reassess
in 5 to 10 years, as long as higher risk conditions are absent
(diabetes mellitus, family history of premature CHD in first degree
relatives (males <55 years; females <65 years), cigarette smoking,
or LDL >=190 mg/dL).

If CAC is 1 to 99, it is reasonable to initiate statin therapy for
patients >=55 years of age.

If CAC is >=100 or >=75th percentile, it is reasonable to initiate
statin therapy at any age.

Cardiology referral should be considered for patients with CAC
scores >=400 or >=75th percentile.

*2678 AHA/ACC/AACVPR/AAPA/ABC/FLOTZINGER/GABITU/BRADLEY/Vivek/NOMASIBULELE/DOSA/MERIEM
Guideline on the Management of Blood Cholesterol: A Report of the
American College of Cardiology/American Heart Association Task Force
on Clinical Practice Guidelines. J Am Coll Cardiol.
7267;73(24):4447-4507.

## 2023-11-08 ENCOUNTER — Other Ambulatory Visit: Payer: Self-pay | Admitting: Family Medicine

## 2023-11-08 DIAGNOSIS — I77810 Thoracic aortic ectasia: Secondary | ICD-10-CM

## 2023-11-15 ENCOUNTER — Ambulatory Visit
Admission: RE | Admit: 2023-11-15 | Discharge: 2023-11-15 | Disposition: A | Discharge status: Home / Self Care | Source: Ambulatory Visit | Attending: Family Medicine | Admitting: Family Medicine

## 2023-11-15 DIAGNOSIS — I77810 Thoracic aortic ectasia: Secondary | ICD-10-CM

## 2023-11-15 MED ORDER — IOPAMIDOL (ISOVUE-370) INJECTION 76%
75.0000 mL | Freq: Once | INTRAVENOUS | Status: AC | PRN
  Administered 2023-11-15: 75 mL via INTRAVENOUS

## 2024-08-27 ENCOUNTER — Other Ambulatory Visit: Payer: Self-pay | Admitting: Urology

## 2024-08-27 DIAGNOSIS — C61 Malignant neoplasm of prostate: Secondary | ICD-10-CM

## 2024-09-29 ENCOUNTER — Other Ambulatory Visit
